# Patient Record
Sex: Male | Born: 1965 | Race: White | Hispanic: No | Smoking: Never smoker
Health system: Southern US, Community
[De-identification: ages and names within clinical notes are randomized; demographics above are authoritative.]

## PROBLEM LIST (undated history)

## (undated) DIAGNOSIS — E119 Type 2 diabetes mellitus without complications: Secondary | ICD-10-CM

## (undated) DIAGNOSIS — Z9889 Other specified postprocedural states: Secondary | ICD-10-CM

## (undated) DIAGNOSIS — E7800 Pure hypercholesterolemia, unspecified: Secondary | ICD-10-CM

## (undated) DIAGNOSIS — I10 Essential (primary) hypertension: Secondary | ICD-10-CM

## (undated) DIAGNOSIS — I219 Acute myocardial infarction, unspecified: Secondary | ICD-10-CM

## (undated) HISTORY — PX: NASAL SINUS SURGERY: SHX719

## (undated) HISTORY — PX: KNEE SURGERY: SHX244

---

## 2015-08-27 ENCOUNTER — Emergency Department (HOSPITAL_BASED_OUTPATIENT_CLINIC_OR_DEPARTMENT_OTHER): Payer: BLUE CROSS/BLUE SHIELD

## 2015-08-27 ENCOUNTER — Encounter (HOSPITAL_BASED_OUTPATIENT_CLINIC_OR_DEPARTMENT_OTHER): Payer: Self-pay | Admitting: Emergency Medicine

## 2015-08-27 ENCOUNTER — Emergency Department (HOSPITAL_BASED_OUTPATIENT_CLINIC_OR_DEPARTMENT_OTHER)
Admission: EM | Admit: 2015-08-27 | Discharge: 2015-08-28 | Disposition: A | Discharge status: Home / Self Care | Payer: BLUE CROSS/BLUE SHIELD | Attending: Emergency Medicine | Admitting: Emergency Medicine

## 2015-08-27 DIAGNOSIS — Y999 Unspecified external cause status: Secondary | ICD-10-CM | POA: Insufficient documentation

## 2015-08-27 DIAGNOSIS — S53401A Unspecified sprain of right elbow, initial encounter: Secondary | ICD-10-CM

## 2015-08-27 DIAGNOSIS — E119 Type 2 diabetes mellitus without complications: Secondary | ICD-10-CM | POA: Diagnosis not present

## 2015-08-27 DIAGNOSIS — S59901A Unspecified injury of right elbow, initial encounter: Secondary | ICD-10-CM | POA: Diagnosis present

## 2015-08-27 DIAGNOSIS — Y929 Unspecified place or not applicable: Secondary | ICD-10-CM | POA: Diagnosis not present

## 2015-08-27 DIAGNOSIS — I10 Essential (primary) hypertension: Secondary | ICD-10-CM | POA: Insufficient documentation

## 2015-08-27 DIAGNOSIS — X500XXA Overexertion from strenuous movement or load, initial encounter: Secondary | ICD-10-CM | POA: Insufficient documentation

## 2015-08-27 DIAGNOSIS — Z79899 Other long term (current) drug therapy: Secondary | ICD-10-CM | POA: Insufficient documentation

## 2015-08-27 DIAGNOSIS — Y9389 Activity, other specified: Secondary | ICD-10-CM | POA: Insufficient documentation

## 2015-08-27 DIAGNOSIS — Z7984 Long term (current) use of oral hypoglycemic drugs: Secondary | ICD-10-CM | POA: Insufficient documentation

## 2015-08-27 HISTORY — DX: Other specified postprocedural states: Z98.890

## 2015-08-27 HISTORY — DX: Pure hypercholesterolemia, unspecified: E78.00

## 2015-08-27 HISTORY — DX: Acute myocardial infarction, unspecified: I21.9

## 2015-08-27 HISTORY — DX: Essential (primary) hypertension: I10

## 2015-08-27 HISTORY — DX: Type 2 diabetes mellitus without complications: E11.9

## 2015-08-27 MED ORDER — OXYCODONE-ACETAMINOPHEN 5-325 MG PO TABS
2.0000 | ORAL_TABLET | Freq: Once | ORAL | Status: AC
  Administered 2015-08-27: 2 via ORAL
  Filled 2015-08-27: qty 2

## 2015-08-27 NOTE — ED Provider Notes (Signed)
CSN: [id number]     Arrival date & time 08/27/15  2259 History  By signing my name below, I, Rosario AdieWilliam Andrew Hiatt, attest that this documentation has been prepared under the direction and in the presence of Samantha Dowless, PA-C.  Electronically Signed: Rosario AdieWilliam Andrew Hiatt, ED Scribe. 08/27/2015. 11:41 PM.   Chief Complaint  Patient presents with  . Elbow Pain   The history is provided by the patient. No language interpreter was used.  HPI Comments: Tamela OddiDanny Lavigne is a 50 y.o. male with a PMHx of DM, MI, HTN, HLD, and cardiac catheterization who presents to the Emergency Department complaining of sudden onset, gradually worsening, constant, burning right elbow pain onset 1 hour PTA. Pt reports that he was lifting a heavy piece of furniture with only his right arm when he heard a sudden "pop" and "felt fire" when he sustained his pain. No OTC pain medications or home remedies tried PTA. His pain is worsened upon movement of the joint. Pt denies numbness or any other symptoms.   Past Medical History  Diagnosis Date  . Diabetes mellitus without complication (HCC)   . Heart attack (HCC)   . Hypertension   . Hypercholesterolemia   . Hx of cardiac catheterization    Past Surgical History  Procedure Laterality Date  . Knee surgery    . Nasal sinus surgery     History reviewed. No pertinent family history. Social History  Substance Use Topics  . Smoking status: Never Smoker   . Smokeless tobacco: None  . Alcohol Use: No    Review of Systems A complete 10 system review of systems was obtained and all systems are negative except as noted in the HPI and PMH.   Allergies  Amoxicillin and Niacin and related  Home Medications   Prior to Admission medications   Medication Sig Start Date End Date Taking? Authorizing Provider  atorvastatin (LIPITOR) 20 MG tablet Take 20 mg by mouth daily.   Yes Historical Provider, MD  clopidogrel (PLAVIX) 75 MG tablet Take 75 mg by mouth daily.   Yes  Historical Provider, MD  LORazepam (ATIVAN) 0.5 MG tablet Take 0.5 mg by mouth every 8 (eight) hours.   Yes Historical Provider, MD  metFORMIN (GLUCOPHAGE) 1000 MG tablet Take 1,000 mg by mouth 2 (two) times daily with a meal.   Yes Historical Provider, MD  metoprolol (LOPRESSOR) 50 MG tablet Take 50 mg by mouth 2 (two) times daily.   Yes Historical Provider, MD  traZODone (DESYREL) 100 MG tablet Take 100 mg by mouth at bedtime.   Yes Historical Provider, MD   BP 128/62 mmHg  Pulse 66  Temp(Src) 98.2 F (36.8 C) (Oral)  Resp 18  Ht 5\' 9"  (1.753 m)  Wt 272 lb (123.378 kg)  BMI 40.15 kg/m2  SpO2 100%   Physical Exam  Constitutional: He is oriented to person, place, and time. He appears well-developed and well-nourished. No distress.  HENT:  Head: Normocephalic and atraumatic.  Eyes: Conjunctivae are normal. Right eye exhibits no discharge. Left eye exhibits no discharge. No scleral icterus.  Cardiovascular: Normal rate.   Pulmonary/Chest: Effort normal.  Musculoskeletal: He exhibits tenderness.  TTP over midshaft humerus of right arm as well as olecranon. No TTP over shoulder. Decreased ROM of elbow limited by pain. Pt unable to pronate or supinate. No obvious bony deformity. Intact distal pulses. Grip strength 5/5.   Neurological: He is alert and oriented to person, place, and time. Coordination normal.  Skin: Skin is  warm and dry. No rash noted. He is not diaphoretic. No erythema. No pallor.  Psychiatric: He has a normal mood and affect. His behavior is normal.  Nursing note and vitals reviewed.  ED Course  Procedures   DIAGNOSTIC STUDIES: Oxygen Saturation is 100% on RA, normal by my interpretation.   COORDINATION OF CARE: 11:40 PM-Discussed next steps with pt. Pt verbalized understanding and is agreeable with the plan.   Imaging Review Dg Shoulder Right  08/28/2015  CLINICAL DATA:  Pain after moving furniture EXAM: RIGHT SHOULDER - 2+ VIEW COMPARISON:  None. FINDINGS:  There is no evidence of fracture or dislocation. There is no evidence of arthropathy or other focal bone abnormality. Soft tissues are unremarkable. IMPRESSION: Negative. Electronically Signed   By: Gerome Sam III M.D   On: 08/28/2015 00:29   Dg Elbow Complete Right  08/27/2015  CLINICAL DATA:  Pain after trauma EXAM: RIGHT ELBOW - COMPLETE 3+ VIEW COMPARISON:  None. FINDINGS: There is no evidence of fracture, dislocation, or joint effusion. There is no evidence of arthropathy or other focal bone abnormality. Soft tissues are unremarkable. IMPRESSION: Negative. Electronically Signed   By: Gerome Sam III M.D   On: 08/27/2015 23:58   Dg Humerus Right  08/28/2015  CLINICAL DATA:  Pain after trauma EXAM: RIGHT HUMERUS - 2+ VIEW COMPARISON:  None. FINDINGS: Evaluation of the distal humerus is limited on the lateral view due to positioning. The distal humerus is normal in appearance on the AP view however. No fractures are identified on today's study. IMPRESSION: No fractures identified. Evaluation of the distal humerus is limited on the lateral view but appears normal on the AP view. If there is concern in the region of the elbow, recommend dedicated imaging of the elbow. Electronically Signed   By: Gerome Sam III M.D   On: 08/28/2015 00:29    I have personally reviewed and evaluated these images as part of my medical decision-making.  MDM   Final diagnoses:  Elbow sprain, right, initial encounter   50 y.o M presents to the ED with acute onset right elbow and midshaft humeral pain after lifting a heavy box earlier tonight. Pt appears un comfortable in ED, but is in NAD. Neurovascularly intact. Decrease ROM of elbow limited by pain. Patient X-Ray negative for obvious fracture or dislocation. Pt likely with severe elbow sprain. Pain managed in ED. Pt advised to follow up with PCP and orthopedics if symptoms persist for possibility of missed fracture diagnosis. Patient given arm sling while in  ED, conservative therapy recommended and discussed. Patient will be dc home & is agreeable with above plan.  I personally performed the services described in this documentation, which was scribed in my presence. The recorded information has been reviewed and is accurate.    Lester Kinsman [place], PA-C 08/28/15 0109  April Palumbo, MD 08/28/15 [phone]

## 2015-08-27 NOTE — ED Notes (Signed)
Pt in xray

## 2015-08-27 NOTE — ED Notes (Signed)
Patient states that he was lifting a piece of furniture and felt his right elbow pop

## 2015-08-28 MED ORDER — ACETAMINOPHEN-CODEINE #3 300-30 MG PO TABS: 1.0000 | ORAL_TABLET | Freq: Four times a day (QID) | ORAL | Status: AC | PRN

## 2015-08-28 MED ORDER — FENTANYL CITRATE (PF) 100 MCG/2ML IJ SOLN
INTRAMUSCULAR | Status: AC
  Filled 2015-08-28: qty 2

## 2015-08-28 MED ORDER — FENTANYL CITRATE (PF) 100 MCG/2ML IJ SOLN
50.0000 ug | Freq: Once | INTRAMUSCULAR | Status: AC
  Administered 2015-08-28: 50 ug via INTRAMUSCULAR

## 2015-08-28 NOTE — ED Notes (Signed)
CMS intact before and after. Pt tolerated well. Pt had no questions.  

## 2015-08-28 NOTE — Discharge Instructions (Signed)
Your xrays today were negative for fracture. You likely have a severe sprain of your elbow. It is possible that you may have dislocated and then relocated your elbow. Keep elbow in sling. Apply ice to affected area. Take pain medication as needed. Follow up with your primary care provider and for additional pain medication if needed. Also follow up with Dr. Amanda PeaGramig with hand orthopedic surgery if your symptoms do not improve. Return to the ED if you experience severe worsening of your symptoms, increased redness or swelling of your elbow, numbness or tingling in your hand.

## 2015-08-28 NOTE — ED Notes (Signed)
Pt d/c at 0130 during epic downtime.

## 2015-08-28 NOTE — ED Notes (Signed)
D/c instructions reviewed. Pt aware he needs to be monitored after IM pain meds given. Voiced understanding.

## 2015-08-28 NOTE — ED Notes (Signed)
Pt in xray

## 2015-08-28 NOTE — ED Notes (Signed)
PA aware no change in pain level after percocet.

## 2015-10-15 ENCOUNTER — Encounter (HOSPITAL_BASED_OUTPATIENT_CLINIC_OR_DEPARTMENT_OTHER): Payer: Self-pay

## 2015-10-15 ENCOUNTER — Emergency Department (HOSPITAL_BASED_OUTPATIENT_CLINIC_OR_DEPARTMENT_OTHER)
Admission: EM | Admit: 2015-10-15 | Discharge: 2015-10-15 | Disposition: A | Discharge status: Home / Self Care | Payer: BLUE CROSS/BLUE SHIELD | Attending: Emergency Medicine | Admitting: Emergency Medicine

## 2015-10-15 ENCOUNTER — Emergency Department (HOSPITAL_BASED_OUTPATIENT_CLINIC_OR_DEPARTMENT_OTHER): Payer: BLUE CROSS/BLUE SHIELD

## 2015-10-15 DIAGNOSIS — R519 Headache, unspecified: Secondary | ICD-10-CM

## 2015-10-15 DIAGNOSIS — R5383 Other fatigue: Secondary | ICD-10-CM | POA: Insufficient documentation

## 2015-10-15 DIAGNOSIS — Z7984 Long term (current) use of oral hypoglycemic drugs: Secondary | ICD-10-CM | POA: Diagnosis not present

## 2015-10-15 DIAGNOSIS — Z79899 Other long term (current) drug therapy: Secondary | ICD-10-CM | POA: Diagnosis not present

## 2015-10-15 DIAGNOSIS — R51 Headache: Secondary | ICD-10-CM | POA: Insufficient documentation

## 2015-10-15 DIAGNOSIS — I10 Essential (primary) hypertension: Secondary | ICD-10-CM | POA: Diagnosis not present

## 2015-10-15 DIAGNOSIS — E119 Type 2 diabetes mellitus without complications: Secondary | ICD-10-CM | POA: Diagnosis not present

## 2015-10-15 LAB — COMPREHENSIVE METABOLIC PANEL
ALBUMIN: 4 g/dL (ref 3.5–5.0)
ALK PHOS: 82 U/L (ref 38–126)
ALT: 51 U/L (ref 17–63)
ANION GAP: 9 (ref 5–15)
AST: 38 U/L (ref 15–41)
BUN: 14 mg/dL (ref 6–20)
CALCIUM: 8.9 mg/dL (ref 8.9–10.3)
CHLORIDE: 105 mmol/L (ref 101–111)
CO2: 22 mmol/L (ref 22–32)
CREATININE: 1 mg/dL (ref 0.61–1.24)
GFR calc Af Amer: >60 mL/min (ref >60)
GFR calc non Af Amer: >60 mL/min (ref >60)
GLUCOSE: 116 mg/dL — AB (ref 65–99)
POTASSIUM: 4.2 mmol/L (ref 3.5–5.1)
SODIUM: 136 mmol/L (ref 135–145)
TOTAL PROTEIN: 6.9 g/dL (ref 6.5–8.1)
Total Bilirubin: 0.8 mg/dL (ref 0.3–1.2)

## 2015-10-15 MED ORDER — METOCLOPRAMIDE HCL 5 MG/ML IJ SOLN
10.0000 mg | Freq: Once | INTRAMUSCULAR | Status: AC
  Administered 2015-10-15: 10 mg via INTRAVENOUS
  Filled 2015-10-15: qty 2

## 2015-10-15 MED ORDER — KETOROLAC TROMETHAMINE 30 MG/ML IJ SOLN
30.0000 mg | Freq: Once | INTRAMUSCULAR | Status: AC
  Administered 2015-10-15: 30 mg via INTRAVENOUS
  Filled 2015-10-15: qty 1

## 2015-10-15 MED ORDER — SODIUM CHLORIDE 0.9 % IV BOLUS (SEPSIS)
1000.0000 mL | Freq: Once | INTRAVENOUS | Status: AC
  Administered 2015-10-15: 1000 mL via INTRAVENOUS

## 2015-10-15 NOTE — ED Notes (Signed)
PA at bedside.

## 2015-10-15 NOTE — ED Provider Notes (Signed)
[facility]   CSN: [id number] Arrival date & time: 10/15/15  1113     History   Chief Complaint Chief Complaint  Patient presents with  . Headache    HPI Jason Hurley is a 50 y.o. male with history of diabetes, hypertension, MI who presents with a headache. Patient states that he has had a constant headache for the past 2 months while he has been getting over bronchitis. Patient reports that he has had resolution of symptoms, except for his headache. She's headache began getting worse yesterday and today. Patient states that his pain as a pressure in the middle of his ears up. Patient has had associated intermittent blurred vision, dizziness with standing, nausea, diarrhea over the past 2 months. Patient denies any numbness or weakness. Patient has had associated fatigue. Patient denies any chest pain, shortness of breath, abdominal pain, vomiting, dysuria.  HPI  Past Medical History:  Diagnosis Date  . Diabetes mellitus without complication (HCC)   . Heart attack (HCC)   . Hx of cardiac catheterization   . Hypercholesterolemia   . Hypertension     There are no active problems to display for this patient.   Past Surgical History:  Procedure Laterality Date  . KNEE SURGERY    . NASAL SINUS SURGERY         Home Medications    Prior to Admission medications   Medication Sig Start Date End Date Taking? Authorizing Provider  [place] (SONATA PO) Take by mouth.   Yes Historical Provider, MD  acetaminophen-codeine (TYLENOL #3) 300-30 MG tablet Take 1-2 tablets by mouth every 6 (six) hours as needed for moderate pain. 08/28/15   [name] Dowless, PA-C  atorvastatin (LIPITOR) 20 MG tablet Take 20 mg by mouth daily.    Historical Provider, MD  clopidogrel (PLAVIX) 75 MG tablet Take 75 mg by mouth daily.    Historical Provider, MD  metFORMIN (GLUCOPHAGE) 1000 MG tablet Take 1,000 mg by mouth 2 (two) times daily with a meal.    Historical Provider, MD   metoprolol (LOPRESSOR) 50 MG tablet Take 50 mg by mouth 2 (two) times daily.    Historical Provider, MD  traZODone (DESYREL) 100 MG tablet Take 100 mg by mouth at bedtime.    Historical Provider, MD    Family History No family history on file.  Social History Social History  Substance Use Topics  . Smoking status: Never Smoker  . Smokeless tobacco: Never Used  . Alcohol use No     Allergies   Amoxicillin; Niacin and related; and Ultram [tramadol]   Review of Systems Review of Systems  Constitutional: Positive for fatigue. Negative for chills and fever.  HENT: Negative for facial swelling and sore throat.   Respiratory: Negative for shortness of breath.   Cardiovascular: Negative for chest pain.  Gastrointestinal: Negative for abdominal pain, nausea and vomiting.  Genitourinary: Negative for dysuria.  Musculoskeletal: Negative for back pain, neck pain and neck stiffness.  Skin: Negative for rash and wound.  Neurological: Positive for dizziness and headaches. Negative for weakness and numbness.  Psychiatric/Behavioral: The patient is not nervous/anxious.      Physical Exam Updated Vital Signs BP 132/58 (BP Location: Right Arm)   Pulse (!) 57   Temp 98.1 F (36.7 C) (Oral)   Resp 16   Ht 5\' 9"  (1.753 m)   Wt 122.5 kg   SpO2 99%   BMI 39.87 kg/m   Physical Exam  Constitutional: He appears well-developed  and well-nourished. No distress.  HENT:  Head: Normocephalic and atraumatic.    Mouth/Throat: Oropharynx is clear and moist. No oropharyngeal exudate.  Eyes: Conjunctivae and EOM are normal. Pupils are equal, round, and reactive to light. Right eye exhibits no discharge. Left eye exhibits no discharge. No scleral icterus.  Neck: Normal range of motion. Neck supple. No thyromegaly present.  No meningismus; full range of motion without pain; no tenderness  Cardiovascular: Normal rate, regular rhythm, normal heart sounds and intact distal pulses.  Exam reveals no  gallop and no friction rub.   No murmur heard. Pulmonary/Chest: Effort normal and breath sounds normal. No stridor. No respiratory distress. He has no wheezes. He has no rales.  Abdominal: Soft. Bowel sounds are normal. He exhibits no distension. There is no tenderness. There is no rebound and no guarding.  Musculoskeletal: He exhibits no edema.  Lymphadenopathy:    He has no cervical adenopathy.  Neurological: He is alert. Coordination normal.  CN 3-12 intact; normal sensation throughout; 5/5 strength in all 4 extremities; equal bilateral grip strength; no ataxia on finger to nose  Skin: Skin is warm and dry. No rash noted. He is not diaphoretic. No pallor.  Psychiatric: He has a normal mood and affect.  Nursing note and vitals reviewed.    ED Treatments / Results  Labs (all labs ordered are listed, but only abnormal results are displayed) Labs Reviewed  COMPREHENSIVE METABOLIC PANEL - Abnormal; Notable for the following:       Result Value   Glucose, Bld 116 (*)    All other components within normal limits    EKG  EKG Interpretation None       Radiology Ct Head Wo Contrast  Result Date: 10/15/2015 CLINICAL DATA:  Headaches for 2 months, increasing over the past few days EXAM: CT HEAD WITHOUT CONTRAST TECHNIQUE: Contiguous axial images were obtained from the base of the skull through the vertex without intravenous contrast. COMPARISON:  None. FINDINGS: Brain: No evidence of acute infarction, hemorrhage, hydrocephalus, extra-axial collection or mass lesion/mass effect. Vascular: No hyperdense vessel or unexpected calcification. Skull: No acute abnormality noted. Sinuses/Orbits: No acute finding. IMPRESSION: No acute intracranial abnormality noted. Electronically Signed   By: Alcide CleverMark  Lukens M.D.   On: 10/15/2015 12:03    Procedures Procedures (including critical care time)  Medications Ordered in ED Medications  sodium chloride 0.9 % bolus 1,000 mL (0 mLs Intravenous Stopped  10/15/15 1442)  metoCLOPramide (REGLAN) injection 10 mg (10 mg Intravenous Given 10/15/15 1159)  ketorolac (TORADOL) 30 MG/ML injection 30 mg (30 mg Intravenous Given 10/15/15 1253)     Initial Impression / Assessment and Plan / ED Course  I have reviewed the triage vital signs and the nursing notes.  Pertinent labs & imaging results that were available during my care of the patient were reviewed by me and considered in my medical decision making (see chart for details).  Clinical Course    Pt HA treated and improved while in ED with 1 L bolus, Toradol, Reglan.  Presentation is like pts typical HA and [facility], Meningitis, or temporal arteritis. CT head negative. Pt is afebrile with no focal neuro deficits, nuchal rigidity. Pt is to follow up with PCP and neurology as needed. Patient advised to take ibuprofen if his symptoms return. Return precautions discussed. Pt verbalizes understanding and is agreeable with plan to dc. Patient vitals stable throughout ED course and discharged in satisfactory condition.   Final Clinical Impressions(s) / ED  Diagnoses   Final diagnoses:  Bad headache    New Prescriptions New Prescriptions   No medications on file     Emi Holes, Cordelia Poche 10/15/15 1448    Gwyneth Sprout, MD 10/15/15 1541

## 2015-10-15 NOTE — ED Triage Notes (Signed)
C/o HA "for weeks"-was seen and sent from urgent care today-NAD-presents to triage in w/c-pt does not use cane/walker and did drive self to ED

## 2015-10-15 NOTE — ED Notes (Signed)
Patient transported to CT 

## 2015-10-15 NOTE — Discharge Instructions (Signed)
Treatment: Make sure to drink plenty of water, at least 8 glasses of water daily. Take ibuprofen as prescribed over-the-counter for your headache if it returns.  Follow-up: Please follow-up with your primary care provider as needed if you continue to have symptoms. You can also follow-up with neurology for further workup if your headaches are continuing. Please return to the emergency department if you develop any new or worsening symptoms.

## 2016-09-26 IMAGING — CT CT HEAD W/O CM
3 series · 15 of 47 positions shown, 18 images · non-contrast
Comparison: None.

CLINICAL DATA: Headaches for 2 months, increasing over the past few
days

EXAM:
CT HEAD WITHOUT CONTRAST
TECHNIQUE: Contiguous axial images were obtained from the base of the skull
through the vertex without intravenous contrast.

[Series 2: head wo · axial · 0.42mm/px · z∈[-144,-19]mm · 9 of 31 slices shown, 12 images]
[im 3/31  brain]
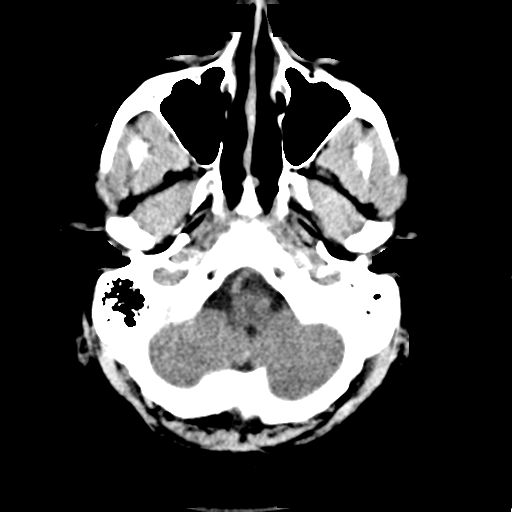
[im 3/31  bone]
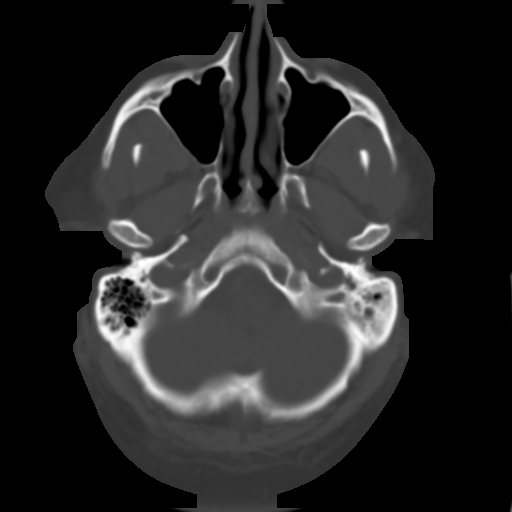
[im 6/31  brain]
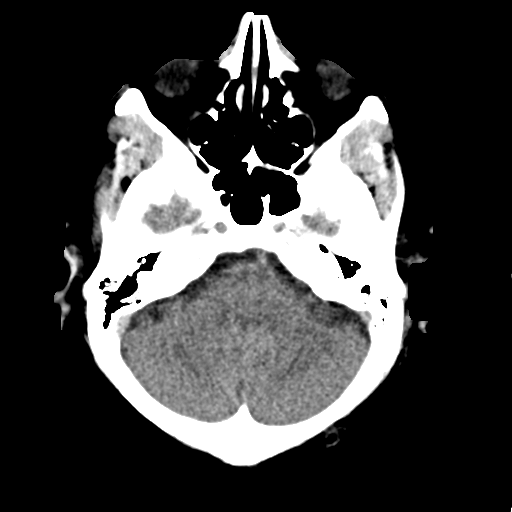
[im 9/31  brain]
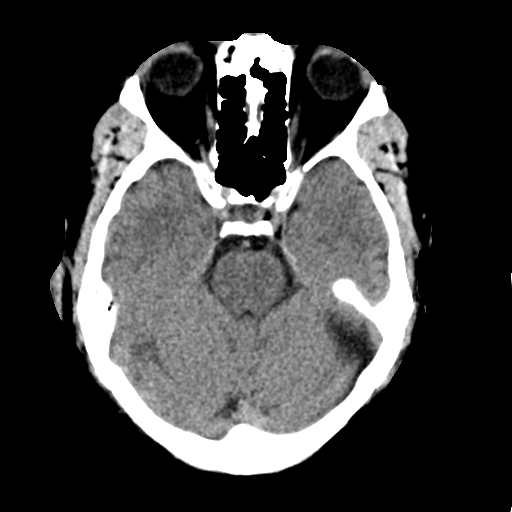
[im 12/31  brain]
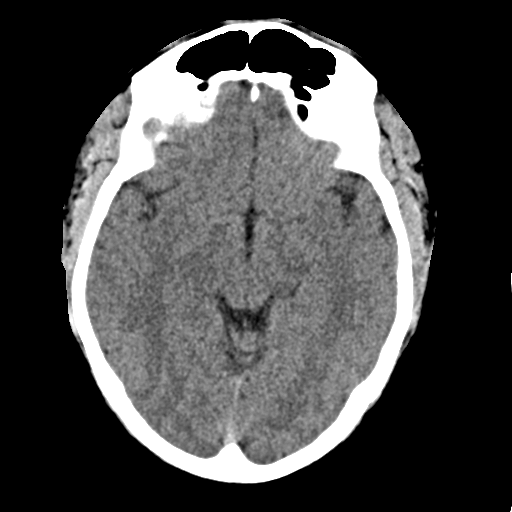
[im 16/31  brain]
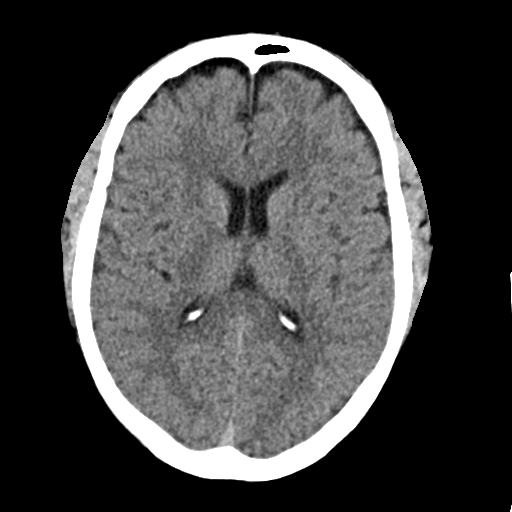
[im 16/31  bone]
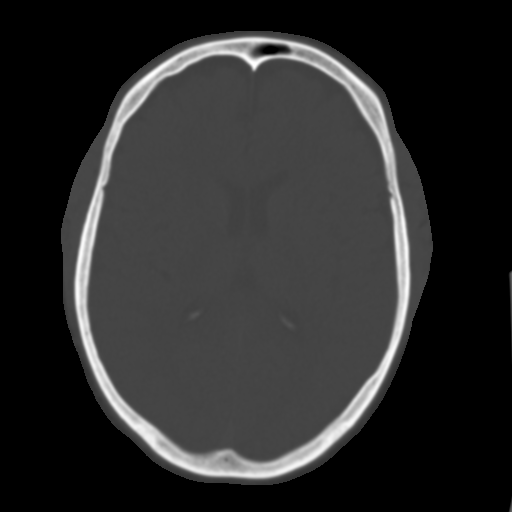
[im 19/31  brain]
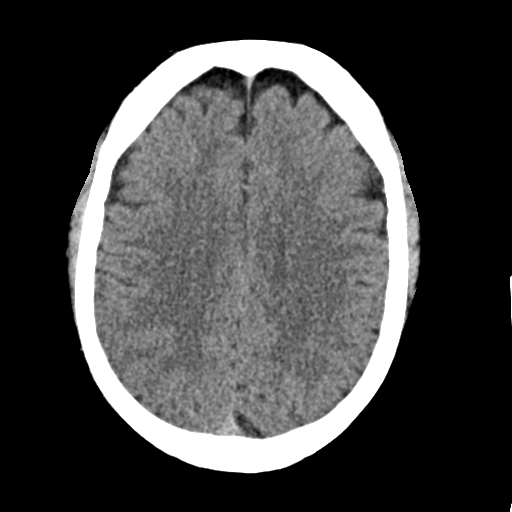
[im 22/31  brain]
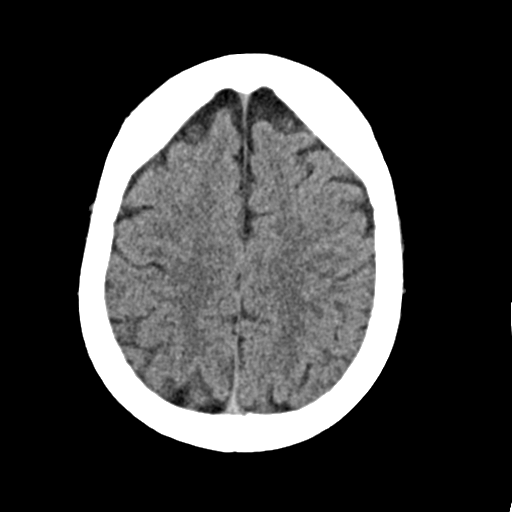
[im 25/31  brain]
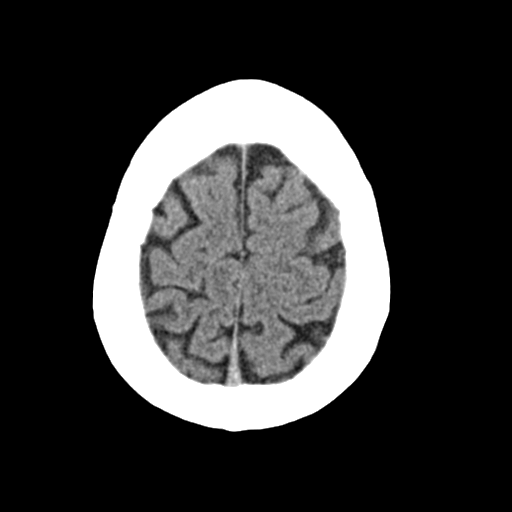
[im 28/31  brain]
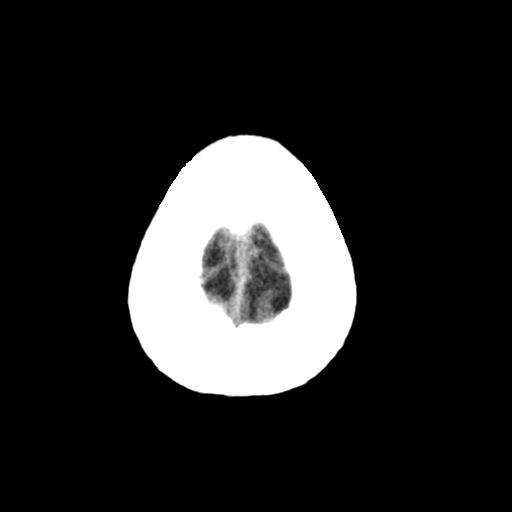
[im 28/31  bone]
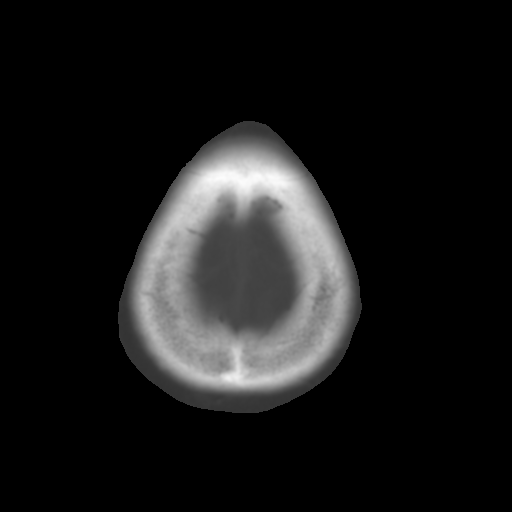

[Series 4: coronal soft · coronal · 0.29mm/px · 3 of 68 slices shown]
[im 23/68  brain]
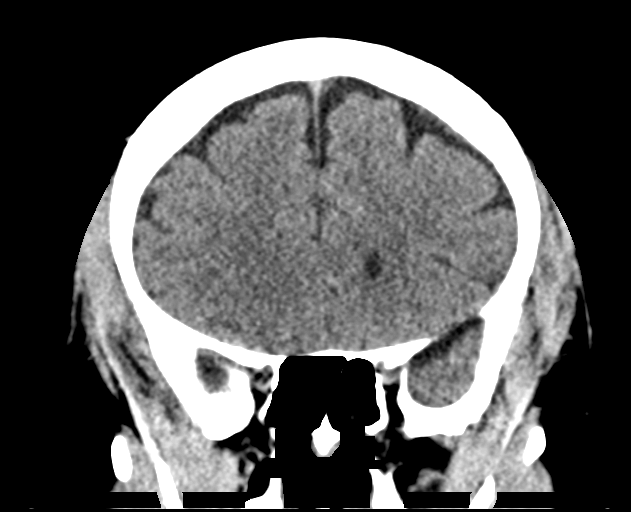
[im 30/68  brain]
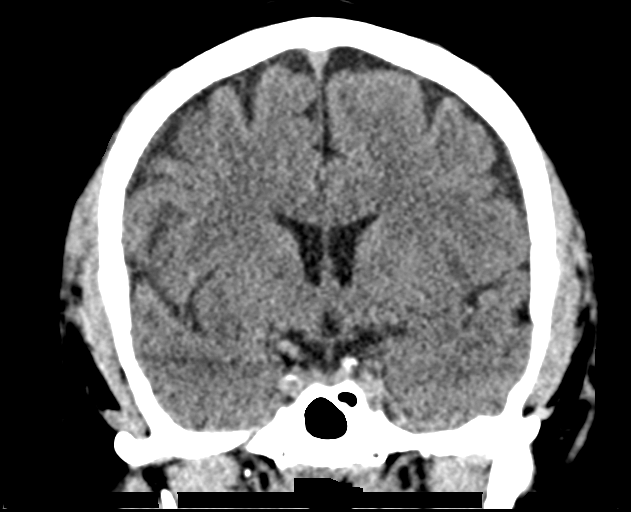
[im 38/68  brain]
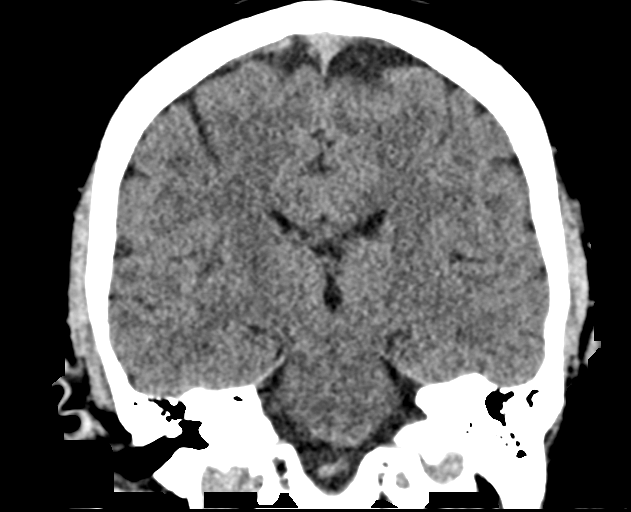

[Series 5: sag soft · sagittal · 0.30mm/px · 3 of 67 slices shown]
[im 23/67  brain]
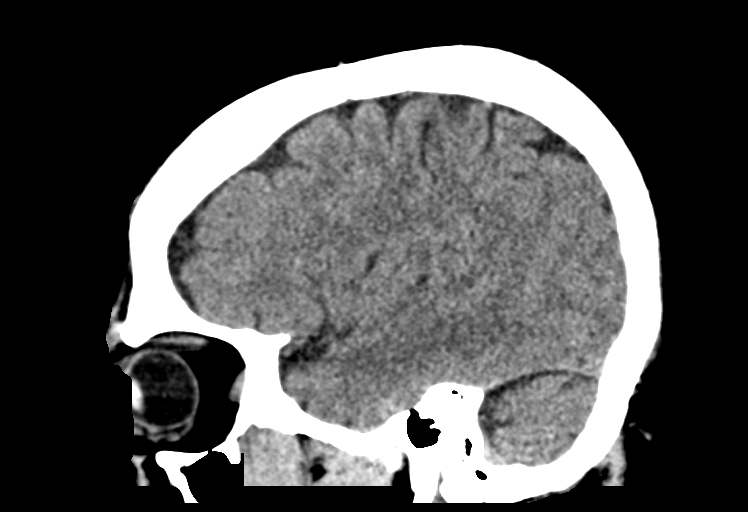
[im 34/67  brain]
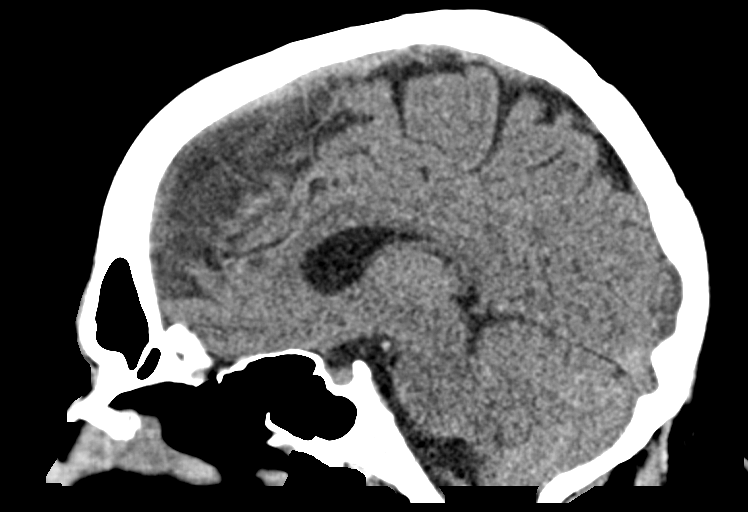
[im 45/67  brain]
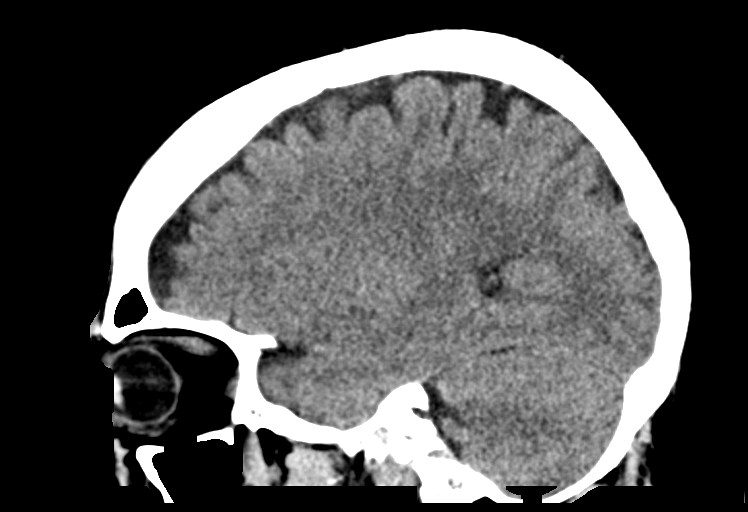

[15 of 47 positions shown; findings below may reference images not displayed]

FINDINGS: Brain: No evidence of acute infarction, hemorrhage, hydrocephalus,
extra-axial collection or mass lesion/mass effect.

Vascular: No hyperdense vessel or unexpected calcification.

Skull: No acute abnormality noted.

Sinuses/Orbits: No acute finding.
IMPRESSION: No acute intracranial abnormality noted.

## 2020-10-08 ENCOUNTER — Other Ambulatory Visit: Payer: Self-pay

## 2020-10-08 ENCOUNTER — Emergency Department (INDEPENDENT_AMBULATORY_CARE_PROVIDER_SITE_OTHER)
Admission: EM | Admit: 2020-10-08 | Discharge: 2020-10-08 | Disposition: A | Discharge status: Home / Self Care | Payer: BC Managed Care – PPO | Source: Home / Self Care | Attending: Family Medicine | Admitting: Family Medicine

## 2020-10-08 DIAGNOSIS — I10 Essential (primary) hypertension: Secondary | ICD-10-CM | POA: Insufficient documentation

## 2020-10-08 DIAGNOSIS — E119 Type 2 diabetes mellitus without complications: Secondary | ICD-10-CM | POA: Insufficient documentation

## 2020-10-08 DIAGNOSIS — H6122 Impacted cerumen, left ear: Secondary | ICD-10-CM

## 2020-10-08 DIAGNOSIS — G4700 Insomnia, unspecified: Secondary | ICD-10-CM | POA: Insufficient documentation

## 2020-10-08 DIAGNOSIS — E785 Hyperlipidemia, unspecified: Secondary | ICD-10-CM | POA: Insufficient documentation

## 2020-10-08 DIAGNOSIS — I2510 Atherosclerotic heart disease of native coronary artery without angina pectoris: Secondary | ICD-10-CM | POA: Insufficient documentation

## 2020-10-08 MED ORDER — CARBAMIDE PEROXIDE 6.5 % OT SOLN: 5.0000 [drp] | Freq: Two times a day (BID) | OTIC | 0 refills | Status: AC

## 2020-10-08 NOTE — ED Provider Notes (Signed)
[name]    CSN: [id number] Arrival date & time: 10/08/20  1650      History   Chief Complaint Chief Complaint  Patient presents with   Ear Fullness    LT    HPI Jason Hurley is a 55 y.o. male.   HPI Here for ear clog feeling Was cleaning ears and felt it plug up over the weekend No cough or cold symptoms No history of ear problems Has diabetes HTN, HLD, CAD - all under control per patient    Past Medical History:  Diagnosis Date   Diabetes mellitus without complication ([place])    Heart attack ([place])    Hx of cardiac catheterization    Hypercholesterolemia    Hypertension     Patient Active Problem List   Diagnosis Date Noted   Diabetes mellitus type 2, controlled, without complications ([place]) 33/54/5625   Essential hypertension 10/08/2020   Hyperlipidemia 10/08/2020   CAD (coronary artery disease) 10/08/2020   Insomnia 10/08/2020    Past Surgical History:  Procedure Laterality Date   KNEE SURGERY     NASAL SINUS SURGERY         Home Medications    Prior to Admission medications   Medication Sig Start Date End Date Taking? Authorizing Provider  carbamide peroxide (DEBROX) 6.5 % OTIC solution Place 5 drops into the left ear 2 (two) times daily. 10/08/20  Yes Raylene Everts, MD  Empagliflozin-metFORMIN HCl ER (SYNJARDY XR) 12.06-998 MG TB24 Take 1 tablet by mouth 2 (two) times daily with a meal. 01/11/20  Yes [provider]  Semaglutide (OZEMPIC, 2 MG/DOSE, [place]) Inject into the skin.   Yes [provider]  acetaminophen-codeine (TYLENOL #3) 300-30 MG tablet Take 1-2 tablets by mouth every 6 (six) hours as needed for moderate pain. 08/28/15   Dowless, Aldona [place] Tripp, PA-C  atorvastatin (LIPITOR) 20 MG tablet Take 20 mg by mouth daily.    [provider]  clopidogrel (PLAVIX) 75 MG tablet Take 75 mg by mouth daily.    [provider]  traZODone (DESYREL) 100 MG tablet Take 100 mg by mouth at bedtime.     [provider]    Family History History reviewed. No pertinent family history.  Social History Social History   Tobacco Use   Smoking status: Never   Smokeless tobacco: Never  Substance Use Topics   Alcohol use: No   Drug use: No     Allergies   Amoxicillin, Niacin and related, and Ultram [tramadol]   Review of Systems Review of Systems See HPI  Physical Exam Triage Vital Signs ED Triage Vitals  Enc Vitals Group     BP 10/08/20 1700 117/73     Pulse Rate 10/08/20 1700 68     Resp 10/08/20 1700 18     Temp 10/08/20 1700 98.2 F (36.8 C)     Temp Source 10/08/20 1700 Oral     SpO2 10/08/20 1700 95 %     Weight --      Height --      Head Circumference --      Peak Flow --      Pain Score 10/08/20 1701 2     Pain Loc --      Pain Edu? --      Excl. in [place]? --    No data found.  Updated Vital Signs BP 117/73 (BP Location: Right Arm)   Pulse 68   Temp 98.2 F (36.8 C) (Oral)  Resp 18   SpO2 95%       Physical Exam Constitutional:      General: He is not in acute distress.    Appearance: He is well-developed. He is obese.  HENT:     Head: Normocephalic and atraumatic.     Right Ear: Tympanic membrane, ear canal and external ear normal.     Left Ear: There is impacted cerumen.     Nose: No congestion.     Mouth/Throat:     Pharynx: No posterior oropharyngeal erythema.  Eyes:     Conjunctiva/sclera: Conjunctivae normal.     Pupils: Pupils are equal, round, and reactive to light.  Cardiovascular:     Rate and Rhythm: Normal rate.  Pulmonary:     Effort: Pulmonary effort is normal. No respiratory distress.  Abdominal:     General: There is no distension.     Palpations: Abdomen is soft.  Musculoskeletal:        General: Normal range of motion.     Cervical back: Normal range of motion.  Skin:    General: Skin is warm and dry.  Neurological:     Mental Status: He is alert.  Psychiatric:        Mood and Affect: Mood normal.         Behavior: Behavior normal.     UC Treatments / Results  Labs (all labs ordered are listed, but only abnormal results are displayed) Labs Reviewed - No data to display  EKG   Radiology No results found.  Procedures Procedures (including critical care time)  Medications Ordered in UC Medications - No data to display  Initial Impression / Assessment and Plan / UC Course  I have reviewed the triage vital signs and the nursing notes.  Pertinent labs & imaging results that were available during my care of the patient were reviewed by me and considered in my medical decision making (see chart for details).     Ear was irrigated by nurse.  I tried to loosen wax with a curette and irrigated again myself.  We were unable to remove the earwax.  Ear canal was starting to look red.  Decision was made to stop Final Clinical Impressions(s) / UC Diagnoses   Final diagnoses:  Hearing loss of left ear due to cerumen impaction     Discharge Instructions      Consider getting an earwax removal kit at the pharmacy Try again in a couple of days after trying the debrox Come back if needed     ED Prescriptions     Medication Sig Dispense Auth. Provider   carbamide peroxide (DEBROX) 6.5 % OTIC solution Place 5 drops into the left ear 2 (two) times daily. 15 mL Raylene Everts, MD      PDMP not reviewed this encounter.   Raylene Everts, MD 10/08/20 Lurline Hare

## 2020-10-08 NOTE — Discharge Instructions (Addendum)
Consider getting an earwax removal kit at the pharmacy Try again in a couple of days after trying the debrox Come back if needed

## 2020-10-08 NOTE — ED Triage Notes (Signed)
Pt c/o hearing loss in LT ear. Stuck Qtip in LT ear Sunday night and then couldn't hear anything. Some pain. Pain 2/10

## 2020-12-09 ENCOUNTER — Other Ambulatory Visit: Payer: Self-pay

## 2020-12-09 ENCOUNTER — Encounter: Payer: Self-pay | Admitting: Emergency Medicine

## 2020-12-09 ENCOUNTER — Emergency Department (INDEPENDENT_AMBULATORY_CARE_PROVIDER_SITE_OTHER)
Admission: EM | Admit: 2020-12-09 | Discharge: 2020-12-09 | Disposition: A | Discharge status: Home / Self Care | Payer: BC Managed Care – PPO | Source: Home / Self Care | Attending: Family Medicine | Admitting: Family Medicine

## 2020-12-09 DIAGNOSIS — J40 Bronchitis, not specified as acute or chronic: Secondary | ICD-10-CM | POA: Diagnosis not present

## 2020-12-09 MED ORDER — DOXYCYCLINE HYCLATE 100 MG PO CAPS: ORAL_CAPSULE | ORAL | 0 refills | Status: DC

## 2020-12-09 NOTE — ED Provider Notes (Signed)
[name]    CSN: [id number] Arrival date & time: 12/09/20  1142      History   Chief Complaint Chief Complaint  Patient presents with   Cough    HPI Jason Hurley is a 55 y.o. male.   Patient complains of two week history of typical cold-like symptoms developing over several days, including mild sore throat, sinus congestion, headache, fatigue, and cough.  His cough has persisted, worse at night when he has occasional wheezing.  He denies pleuritic pain and fever. He has a past history of pneumonia.  The history is provided by the patient.   Past Medical History:  Diagnosis Date   Diabetes mellitus without complication (HCC)    Heart attack (HCC)    Hx of cardiac catheterization    Hypercholesterolemia    Hypertension     Patient Active Problem List   Diagnosis Date Noted   Diabetes mellitus type 2, controlled, without complications (HCC) 10/08/2020   Essential hypertension 10/08/2020   Hyperlipidemia 10/08/2020   CAD (coronary artery disease) 10/08/2020   Insomnia 10/08/2020    Past Surgical History:  Procedure Laterality Date   KNEE SURGERY     NASAL SINUS SURGERY         Home Medications    Prior to Admission medications   Medication Sig Start Date End Date Taking? Authorizing Provider  doxycycline (VIBRAMYCIN) 100 MG capsule Take one cap PO Q12hr with food. 12/09/20  Yes Lattie Haw, MD  acetaminophen-codeine (TYLENOL #3) 300-30 MG tablet Take 1-2 tablets by mouth every 6 (six) hours as needed for moderate pain. 08/28/15   Dowless, Lelon Mast Tripp, PA-C  atorvastatin (LIPITOR) 20 MG tablet Take 20 mg by mouth daily.    [provider]  carbamide peroxide (DEBROX) 6.5 % OTIC solution Place 5 drops into the left ear 2 (two) times daily. 10/08/20   [name], MD  clopidogrel (PLAVIX) 75 MG tablet Take 75 mg by mouth daily.    [provider]  Empagliflozin-metFORMIN HCl ER (SYNJARDY XR) 12.06-998 MG TB24 Take 1  tablet by mouth 2 (two) times daily with a meal. 01/11/20   [provider]  Semaglutide (OZEMPIC, 2 MG/DOSE, [place]) Inject into the skin.    [provider]  traZODone (DESYREL) 100 MG tablet Take 100 mg by mouth at bedtime.    [provider]    Family History Family History  Problem Relation Age of Onset   Alzheimer's disease Father    Pancreatic cancer Father     Social History Social History   Tobacco Use   Smoking status: Never   Smokeless tobacco: Never  Vaping Use   Vaping Use: Never used  Substance Use Topics   Alcohol use: Yes   Drug use: No     Allergies   Amoxicillin, Niacin and related, and Ultram [tramadol]   Review of Systems Review of Systems + sore throat + cough No pleuritic pain + wheezing at night + nasal congestion + post-nasal drainage No sinus pain/pressure No itchy/red eyes ? Earache + dizziness No hemoptysis No SOB No fever/chills No nausea No vomiting No abdominal pain Occasional diarrhea No urinary symptoms No skin rash + fatigue + myalgias + headache Used OTC meds (Tylenol) without relief   Physical Exam Triage Vital Signs ED Triage Vitals  Enc Vitals Group     BP 12/09/20 1403 102/68     Pulse Rate 12/09/20 1403 72     Resp 12/09/20 1403 18  Temp 12/09/20 1403 98 F (36.7 C)     Temp Source 12/09/20 1403 Oral     SpO2 12/09/20 1403 94 %     Weight 12/09/20 1404 235 lb (106.6 kg)     Height 12/09/20 1404 5\' 9"  (1.753 m)     Head Circumference --      Peak Flow --      Pain Score 12/09/20 1404 3     Pain Loc --      Pain Edu? --      Excl. in GC? --    No data found.  Updated Vital Signs BP 102/68 (BP Location: Left Arm)   Pulse 72   Temp 98 F (36.7 C) (Oral)   Resp 18   Ht 5\' 9"  (1.753 m)   Wt 106.6 kg   SpO2 94%   BMI 34.70 kg/m   Visual Acuity Right Eye Distance:   Left Eye Distance:   Bilateral Distance:    Right Eye Near:   Left Eye Near:    Bilateral Near:      Physical Exam Nursing notes and Vital Signs reviewed. Appearance:  Patient appears stated age, and in no acute distress Eyes:  Pupils are equal, round, and reactive to light and accomodation.  Extraocular movement is intact.  Conjunctivae are not inflamed  Ears:  Canals normal.  Tympanic membranes normal.  Nose:  Mildly congested turbinates.  No sinus tenderness.   Pharynx:  Normal Neck:  Supple.  Mildly enlarged lateral nodes are present, tender to palpation on the left.   Lungs:  Clear to auscultation.  Breath sounds are equal.  Moving air well. Heart:  Regular rate and rhythm without murmurs, rubs, or gallops.  Abdomen:  Nontender without masses or hepatosplenomegaly.  Bowel sounds are present.  No CVA or flank tenderness.  Extremities:  No edema.  Skin:  No rash present.   UC Treatments / Results  Labs (all labs ordered are listed, but only abnormal results are displayed) Labs Reviewed - No data to display  EKG   Radiology No results found.  Procedures Procedures (including critical care time)  Medications Ordered in UC Medications - No data to display  Initial Impression / Assessment and Plan / UC Course  I have reviewed the triage vital signs and the nursing notes.  Pertinent labs & imaging results that were available during my care of the patient were reviewed by me and considered in my medical decision making (see chart for details).    Begin doxycycline for atypical coverage. Followup with Family Doctor if not improved in one week.   Final Clinical Impressions(s) / UC Diagnoses   Final diagnoses:  Bronchitis     Discharge Instructions      Take plain guaifenesin (1200mg  extended release tabs such as Mucinex) twice daily, with plenty of water, for cough and congestion.  May add Pseudoephedrine (30mg , one or two every 4 to 6 hours) for sinus congestion.  Get adequate rest.   May use Afrin nasal spray (or generic oxymetazoline) each morning for about 5  days and then discontinue.  Also recommend using saline nasal spray several times daily and saline nasal irrigation (AYR is a common brand).  Use Flonase nasal spray each morning after using Afrin nasal spray and saline nasal irrigation. Try warm salt water gargles for sore throat.  Stop all antihistamines for now, and other non-prescription cough/cold preparations. May take Delsym Cough Suppressant ("12 Hour Cough Relief") at bedtime for nighttime cough.  If symptoms become significantly worse during the night or over the weekend, proceed to the local emergency room.    ED Prescriptions     Medication Sig Dispense Auth. Provider   doxycycline (VIBRAMYCIN) 100 MG capsule Take one cap PO Q12hr with food. 14 capsule Lattie Haw, MD         Lattie Haw, MD 12/10/20 [phone]

## 2020-12-09 NOTE — ED Triage Notes (Signed)
Cough, headache, congestion, body aches, dizziness x 2 weeks Vaccinated for Covid and Flu

## 2020-12-09 NOTE — Discharge Instructions (Signed)
Take plain guaifenesin (1200mg  extended release tabs such as Mucinex) twice daily, with plenty of water, for cough and congestion.  May add Pseudoephedrine (30mg , one or two every 4 to 6 hours) for sinus congestion.  Get adequate rest.   May use Afrin nasal spray (or generic oxymetazoline) each morning for about 5 days and then discontinue.  Also recommend using saline nasal spray several times daily and saline nasal irrigation (AYR is a common brand).  Use Flonase nasal spray each morning after using Afrin nasal spray and saline nasal irrigation. Try warm salt water gargles for sore throat.  Stop all antihistamines for now, and other non-prescription cough/cold preparations. May take Delsym Cough Suppressant ("12 Hour Cough Relief") at bedtime for nighttime cough.   If symptoms become significantly worse during the night or over the weekend, proceed to the local emergency room.

## 2021-09-29 ENCOUNTER — Encounter: Payer: Self-pay | Admitting: Emergency Medicine

## 2021-09-29 ENCOUNTER — Ambulatory Visit
Admission: EM | Admit: 2021-09-29 | Discharge: 2021-09-29 | Disposition: A | Discharge status: Home / Self Care | Payer: 59 | Attending: Family Medicine | Admitting: Family Medicine

## 2021-09-29 DIAGNOSIS — J40 Bronchitis, not specified as acute or chronic: Secondary | ICD-10-CM | POA: Diagnosis not present

## 2021-09-29 DIAGNOSIS — J309 Allergic rhinitis, unspecified: Secondary | ICD-10-CM | POA: Diagnosis not present

## 2021-09-29 DIAGNOSIS — R059 Cough, unspecified: Secondary | ICD-10-CM

## 2021-09-29 MED ORDER — FEXOFENADINE HCL 180 MG PO TABS: 180.0000 mg | ORAL_TABLET | Freq: Every day | ORAL | 0 refills | Status: DC

## 2021-09-29 MED ORDER — DOXYCYCLINE HYCLATE 100 MG PO CAPS: 100.0000 mg | ORAL_CAPSULE | Freq: Two times a day (BID) | ORAL | 0 refills | Status: AC

## 2021-09-29 MED ORDER — PREDNISONE 10 MG (21) PO TBPK: ORAL_TABLET | Freq: Every day | ORAL | 0 refills | Status: DC

## 2021-09-29 NOTE — ED Provider Notes (Signed)
[facility]    CSN: [id number] Arrival date & time: 09/29/21  1616      History   Chief Complaint Chief Complaint  Patient presents with   Cough    HPI Jason Hurley is a 56 y.o. male.   HPI Very pleasant 56 year old male presents with productive cough, diarrhea, headache, fatigue, chest congestion for 3 weeks.  Reports taking OTC [name] and DayQuil.  PMH significant for CAD, HTN, and T2 DM without complication.  Past Medical History:  Diagnosis Date   Diabetes mellitus without complication (HCC)    Heart attack (HCC)    Hx of cardiac catheterization    Hypercholesterolemia    Hypertension     Patient Active Problem List   Diagnosis Date Noted   Diabetes mellitus type 2, controlled, without complications (HCC) 10/08/2020   Essential hypertension 10/08/2020   Hyperlipidemia 10/08/2020   CAD (coronary artery disease) 10/08/2020   Insomnia 10/08/2020    Past Surgical History:  Procedure Laterality Date   KNEE SURGERY     NASAL SINUS SURGERY         Home Medications    Prior to Admission medications   Medication Sig Start Date End Date Taking? Authorizing Provider  acetaminophen-codeine (TYLENOL #3) 300-30 MG tablet Take 1-2 tablets by mouth every 6 (six) hours as needed for moderate pain. 08/28/15  Yes Dowless, [name], PA-C  atorvastatin (LIPITOR) 20 MG tablet Take 20 mg by mouth daily.   Yes [provider]  carbamide peroxide (DEBROX) 6.5 % OTIC solution Place 5 drops into the left ear 2 (two) times daily. 10/08/20  Yes [name], MD  clopidogrel (PLAVIX) 75 MG tablet Take 75 mg by mouth daily.   Yes [provider]  doxycycline (VIBRAMYCIN) 100 MG capsule Take 1 capsule (100 mg total) by mouth 2 (two) times daily for 10 days. 09/29/21 10/09/21 Yes [name], FNP  Empagliflozin-metFORMIN HCl ER (SYNJARDY XR) 12.06-998 MG TB24 Take 1 tablet by mouth 2 (two) times daily with a meal. 01/11/20  Yes [provider]  fexofenadine (ALLEGRA ALLERGY) 180 MG tablet Take 1 tablet (180 mg total) by mouth daily for 15 days. 09/29/21 10/14/21 Yes [name], FNP  predniSONE (STERAPRED UNI-PAK 21 TAB) 10 MG (21) TBPK tablet Take by mouth daily. Take 6 tabs by mouth daily  for 2 days, then 5 tabs for 2 days, then 4 tabs for 2 days, then 3 tabs for 2 days, 2 tabs for 2 days, then 1 tab by mouth daily for 2 days 09/29/21  Yes [name], FNP  Semaglutide (OZEMPIC, 2 MG/DOSE, [place]) Inject into the skin.   Yes [provider]  traZODone (DESYREL) 100 MG tablet Take 100 mg by mouth at bedtime.   Yes [provider]    Family History Family History  Problem Relation Age of Onset   Alzheimer's disease Father    Pancreatic cancer Father     Social History Social History   Tobacco Use   Smoking status: Never   Smokeless tobacco: Never  Vaping Use   Vaping Use: Never used  Substance Use Topics   Alcohol use: Yes   Drug use: No     Allergies   Amoxicillin, Niacin and related, and Ultram [tramadol]   Review of Systems Review of Systems  Constitutional:  Positive for fatigue.  HENT:  Positive for congestion.   Respiratory:  Positive for cough.        Chest congestion for 3  weeks  All other systems reviewed and are negative.    Physical Exam Triage Vital Signs ED Triage Vitals  Enc Vitals Group     BP 09/29/21 1625 124/73     Pulse Rate 09/29/21 1625 62     Resp 09/29/21 1625 18     Temp 09/29/21 1625 99.1 F (37.3 C)     Temp Source 09/29/21 1625 Oral     SpO2 09/29/21 1625 93 %     Weight 09/29/21 1626 250 lb (113.4 kg)     Height 09/29/21 1626 5\' 9"  (1.753 m)     Head Circumference --      Peak Flow --      Pain Score 09/29/21 1626 0     Pain Loc --      Pain Edu? --      Excl. in GC? --    No data found.  Updated Vital Signs BP 124/73 (BP Location: Right Arm)   Pulse 62   Temp 99.1 F (37.3 C) (Oral)   Resp 18   Ht 5\' 9"  (1.753 m)   Wt 250 lb  (113.4 kg)   SpO2 93%   BMI 36.92 kg/m   Physical Exam Vitals and nursing note reviewed.  Constitutional:      Appearance: He is obese. He is ill-appearing.  HENT:     Head: Normocephalic and atraumatic.     Right Ear: Tympanic membrane, ear canal and external ear normal.     Left Ear: Tympanic membrane, ear canal and external ear normal.     Mouth/Throat:     Mouth: Mucous membranes are moist.     Pharynx: Oropharynx is clear.     Comments: Significant amount of clear drainage of posterior oropharynx noted Eyes:     Extraocular Movements: Extraocular movements intact.     Conjunctiva/sclera: Conjunctivae normal.     Pupils: Pupils are equal, round, and reactive to light.  Cardiovascular:     Rate and Rhythm: Normal rate and regular rhythm.     Pulses: Normal pulses.     Heart sounds: Normal heart sounds.  Pulmonary:     Effort: Pulmonary effort is normal.     Breath sounds: Rhonchi present. No wheezing or rales.     Comments: Infrequent nonproductive cough and diffuse scattered rhonchi noted on exam Musculoskeletal:        General: Normal range of motion.  Skin:    General: Skin is warm and dry.  Neurological:     General: No focal deficit present.     Mental Status: He is alert and oriented to person, place, and time. Mental status is at baseline.      UC Treatments / Results  Labs (all labs ordered are listed, but only abnormal results are displayed) Labs Reviewed - No data to display  EKG   Radiology No results found.  Procedures Procedures (including critical care time)  Medications Ordered in UC Medications - No data to display  Initial Impression / Assessment and Plan / UC Course  I have reviewed the triage vital signs and the nursing notes.  Pertinent labs & imaging results that were available during my care of the patient were reviewed by me and considered in my medical decision making (see chart for details).     MDM: 1.  Bronchitis-Rx'd  Doxycycline; 2.  Cough-Rx'd Sterapred Unipak; 3.  Allergic rhinitis-Rx'd Allegra. Advised patient to take medication as directed with food to completion.  Advised patient to take Allegra  and prednisone with Doxycycline over the next 10 days.  Advised may to stop Allegra after 5 days and use as needed for concurrent postnasal drainage/drip.  Encouraged patient to increase daily water intake while taking these medications.  Advised patient if symptoms worsen and/or unresolved please follow-up with PCP or here for further evaluation.  Patient discharged home, hemodynamically stable. Final Clinical Impressions(s) / UC Diagnoses   Final diagnoses:  Bronchitis  Cough, unspecified type  Allergic rhinitis, unspecified seasonality, unspecified trigger     Discharge Instructions      Advised patient to take medication as directed with food to completion.  Advised patient to take Allegra and prednisone with Doxycycline over the next 10 days.  Advised may to stop Allegra after 5 days and use as needed for concurrent postnasal drainage/drip.  Encouraged patient to increase daily water intake while taking these medications.  Advised patient if symptoms worsen and/or unresolved please follow-up with PCP or here for further evaluation.     ED Prescriptions     Medication Sig Dispense Auth. Provider   doxycycline (VIBRAMYCIN) 100 MG capsule Take 1 capsule (100 mg total) by mouth 2 (two) times daily for 10 days. 20 capsule [name], FNP   predniSONE (STERAPRED UNI-PAK 21 TAB) 10 MG (21) TBPK tablet Take by mouth daily. Take 6 tabs by mouth daily  for 2 days, then 5 tabs for 2 days, then 4 tabs for 2 days, then 3 tabs for 2 days, 2 tabs for 2 days, then 1 tab by mouth daily for 2 days 42 tablet [name], FNP   fexofenadine [facility] ALLERGY) 180 MG tablet Take 1 tablet (180 mg total) by mouth daily for 15 days. 15 tablet [name], FNP      PDMP not reviewed this encounter.   [name], FNP 09/29/21 1724

## 2021-09-29 NOTE — ED Triage Notes (Signed)
Patient c/o productive cough, diarrhea, headache, fatigue, chest congestion x 3 weeks.  Patient has taken [place] DM and Dayquil.

## 2021-09-29 NOTE — Discharge Instructions (Addendum)
Advised patient to take medication as directed with food to completion.  Advised patient to take Allegra and prednisone with Doxycycline over the next 10 days.  Advised may to stop Allegra after 5 days and use as needed for concurrent postnasal drainage/drip.  Encouraged patient to increase daily water intake while taking these medications.  Advised patient if symptoms worsen and/or unresolved please follow-up with PCP or here for further evaluation.

## 2022-06-12 ENCOUNTER — Ambulatory Visit
Admission: EM | Admit: 2022-06-12 | Discharge: 2022-06-12 | Disposition: A | Discharge status: Home / Self Care | Payer: 59 | Attending: Family Medicine | Admitting: Family Medicine

## 2022-06-12 DIAGNOSIS — J309 Allergic rhinitis, unspecified: Secondary | ICD-10-CM | POA: Diagnosis not present

## 2022-06-12 DIAGNOSIS — R059 Cough, unspecified: Secondary | ICD-10-CM

## 2022-06-12 MED ORDER — PREDNISONE 20 MG PO TABS: ORAL_TABLET | ORAL | 0 refills | Status: AC

## 2022-06-12 MED ORDER — BENZONATATE 200 MG PO CAPS: 200.0000 mg | ORAL_CAPSULE | Freq: Three times a day (TID) | ORAL | 0 refills | Status: AC | PRN

## 2022-06-12 MED ORDER — FEXOFENADINE HCL 180 MG PO TABS: 180.0000 mg | ORAL_TABLET | Freq: Every day | ORAL | 0 refills | Status: AC

## 2022-06-12 MED ORDER — AZITHROMYCIN 250 MG PO TABS: 250.0000 mg | ORAL_TABLET | Freq: Every day | ORAL | 0 refills | Status: AC

## 2022-06-12 NOTE — ED Triage Notes (Signed)
Patient presents to UC for cough and SOB since yesterday. Not taking any OTC meds. Concerned with bronchitis.

## 2022-06-12 NOTE — ED Provider Notes (Signed)
[facility]    CSN: [id number] Arrival date & time: 06/12/22  1037      History   Chief Complaint Chief Complaint  Patient presents with   Cough    HPI Jason Hurley is a 57 y.o. male.   HPI 57 year old male presents with cough and shortness of breath since yesterday.  PMH significant for obesity, CAD, HTN, and T2DM.  Is currently on Plavix and denies any unusual bleeding.   Past Medical History:  Diagnosis Date   Diabetes mellitus without complication (HCC)    Heart attack (HCC)    Hx of cardiac catheterization    Hypercholesterolemia    Hypertension     Patient Active Problem List   Diagnosis Date Noted   Diabetes mellitus type 2, controlled, without complications (HCC) 10/08/2020   Essential hypertension 10/08/2020   Hyperlipidemia 10/08/2020   CAD (coronary artery disease) 10/08/2020   Insomnia 10/08/2020    Past Surgical History:  Procedure Laterality Date   KNEE SURGERY     NASAL SINUS SURGERY         Home Medications    Prior to Admission medications   Medication Sig Start Date End Date Taking? Authorizing Provider  azithromycin (ZITHROMAX) 250 MG tablet Take 1 tablet (250 mg total) by mouth daily. Take first 2 tablets together, then 1 every day until finished. 06/12/22  Yes [name], FNP  benzonatate (TESSALON) 200 MG capsule Take 1 capsule (200 mg total) by mouth 3 (three) times daily as needed for up to 7 days. 06/12/22 06/19/22 Yes [name], FNP  fexofenadine [facility] ALLERGY) 180 MG tablet Take 1 tablet (180 mg total) by mouth daily for 15 days. 06/12/22 06/27/22 Yes [name], FNP  predniSONE (DELTASONE) 20 MG tablet Take 3 tabs PO daily x 5 days. 06/12/22  Yes [name], FNP  acetaminophen-codeine (TYLENOL #3) 300-30 MG tablet Take 1-2 tablets by mouth every 6 (six) hours as needed for moderate pain. 08/28/15   Dowless, Lelon Mast Tripp, PA-C  atorvastatin (LIPITOR) 20 MG tablet Take 20 mg by mouth daily.    [provider]  carbamide peroxide (DEBROX) 6.5 % OTIC solution Place 5 drops into the left ear 2 (two) times daily. 10/08/20   [name], MD  clopidogrel (PLAVIX) 75 MG tablet Take 75 mg by mouth daily.    [provider]  Empagliflozin-metFORMIN HCl ER (SYNJARDY XR) 12.06-998 MG TB24 Take 1 tablet by mouth 2 (two) times daily with a meal. 01/11/20   [provider]  Semaglutide (OZEMPIC, 2 MG/DOSE, [place]) Inject into the skin.    [provider]  traZODone (DESYREL) 100 MG tablet Take 100 mg by mouth at bedtime.    [provider]    Family History Family History  Problem Relation Age of Onset   Alzheimer's disease Father    Pancreatic cancer Father     Social History Social History   Tobacco Use   Smoking status: Never   Smokeless tobacco: Never  Vaping Use   Vaping Use: Never used  Substance Use Topics   Alcohol use: Yes   Drug use: No     Allergies   Hydrocodone-acetaminophen, Amoxicillin, Niacin and related, and Ultram [tramadol]   Review of Systems Review of Systems  HENT:  Positive for congestion.   Respiratory:  Positive for cough.   All other systems reviewed and are negative.    Physical Exam Triage Vital Signs ED Triage Vitals  Enc Vitals Group  BP 06/12/22 1050 112/67     Pulse Rate 06/12/22 1050 (!) 55     Resp 06/12/22 1050 16     Temp 06/12/22 1050 98.3 F (36.8 C)     Temp Source 06/12/22 1050 Oral     SpO2 06/12/22 1050 98 %     Weight --      Height --      Head Circumference --      Peak Flow --      Pain Score 06/12/22 1049 4     Pain Loc --      Pain Edu? --      Excl. in GC? --    No data found.  Updated Vital Signs BP 112/67 (BP Location: Left Arm)   Pulse (!) 55   Temp 98.3 F (36.8 C) (Oral)   Resp 16   SpO2 98%   Physical Exam Vitals and nursing note reviewed.  Constitutional:      Appearance: Normal appearance. He is obese.  HENT:     Head: Normocephalic and atraumatic.     Right  Ear: Tympanic membrane and ear canal normal.     Left Ear: Tympanic membrane, ear canal and external ear normal.     Mouth/Throat:     Mouth: Mucous membranes are moist.     Pharynx: Oropharynx is clear.     Comments: Significant amount of clear drainage of posterior oropharynx noted Eyes:     Conjunctiva/sclera: Conjunctivae normal.     Pupils: Pupils are equal, round, and reactive to light.  Cardiovascular:     Rate and Rhythm: Normal rate and regular rhythm.     Pulses: Normal pulses.     Heart sounds: Normal heart sounds. No murmur heard. Pulmonary:     Effort: Pulmonary effort is normal.     Breath sounds: Normal breath sounds. No wheezing, rhonchi or rales.     Comments: Frequent nonproductive cough noted on exam Abdominal:     General: Bowel sounds are normal. There is no distension.     Palpations: There is no mass.     Tenderness: There is no abdominal tenderness. There is no right CVA tenderness, left CVA tenderness, guarding or rebound.     Hernia: No hernia is present.  Musculoskeletal:        General: Normal range of motion.     Cervical back: Normal range of motion and neck supple.  Skin:    General: Skin is warm and dry.  Neurological:     General: No focal deficit present.     Mental Status: He is alert and oriented to person, place, and time. Mental status is at baseline.      UC Treatments / Results  Labs (all labs ordered are listed, but only abnormal results are displayed) Labs Reviewed - No data to display  EKG   Radiology No results found.  Procedures Procedures (including critical care time)  Medications Ordered in UC Medications - No data to display  Initial Impression / Assessment and Plan / UC Course  I have reviewed the triage vital signs and the nursing notes.  Pertinent labs & imaging results that were available during my care of the patient were reviewed by me and considered in my medical decision making (see chart for details).      MDM: 1.  Cough, unspecified type-Rx'd Zithromax (500 mg p.o. day 1, then 250 mg p.o. x 4 days, prednisone 60 mg daily x 5 days, Rx'd Tessalon 200  mg 3 times daily, as needed for cough; 2.  Allergic rhinitis, unspecified seasonality, unspecified trigger-Rx'd Allegra 180 mg daily x 5 days, then as needed for concurrent postnasal drainage/drip. Instructed patient to take medication as directed with food to completion.  Advised patient to take prednisone and Allegra with Zithromax daily for the next 5 days.  Advised may use Allegra as needed afterwards for concurrent postnasal drainage/drip.  Advised may use Tessalon daily or as needed for cough.  Encouraged increase daily water intake to 64 ounces per day while taking these medications.  Advised if symptoms worsen and/or unresolved please follow-up with PCP or here for further evaluation.  Final Clinical Impressions(s) / UC Diagnoses   Final diagnoses:  Cough, unspecified type  Allergic rhinitis, unspecified seasonality, unspecified trigger     Discharge Instructions      Instructed patient to take medication as directed with food to completion.  Advised patient to take prednisone and Allegra with Zithromax daily for the next 5 days.  Advised may use Allegra as needed afterwards for concurrent postnasal drainage/drip.  Advised may use Tessalon daily or as needed for cough.  Encouraged increase daily water intake to 64 ounces per day while taking these medications.  Advised if symptoms worsen and/or unresolved please follow-up with PCP or here for further evaluation.     ED Prescriptions     Medication Sig Dispense Auth. Provider   azithromycin (ZITHROMAX) 250 MG tablet Take 1 tablet (250 mg total) by mouth daily. Take first 2 tablets together, then 1 every day until finished. 6 tablet [name], FNP   predniSONE (DELTASONE) 20 MG tablet Take 3 tabs PO daily x 5 days. 15 tablet [name], FNP   fexofenadine [facility] ALLERGY) 180 MG  tablet Take 1 tablet (180 mg total) by mouth daily for 15 days. 15 tablet [name], FNP   benzonatate (TESSALON) 200 MG capsule Take 1 capsule (200 mg total) by mouth 3 (three) times daily as needed for up to 7 days. 40 capsule [name], FNP      PDMP not reviewed this encounter.   [name], FNP 06/12/22 1204

## 2022-06-12 NOTE — Discharge Instructions (Addendum)
Instructed patient to take medication as directed with food to completion.  Advised patient to take prednisone and Allegra with Zithromax daily for the next 5 days.  Advised may use Allegra as needed afterwards for concurrent postnasal drainage/drip.  Advised may use Tessalon daily or as needed for cough.  Encouraged increase daily water intake to 64 ounces per day while taking these medications.  Advised if symptoms worsen and/or unresolved please follow-up with PCP or here for further evaluation.
